# Patient Record
Sex: Male | Born: 1940 | Race: White | Hispanic: No | Marital: Married | Smoking: Never smoker
Health system: Southern US, Community
[De-identification: ages and names within clinical notes are randomized; demographics above are authoritative.]

---

## 2000-04-18 ENCOUNTER — Encounter: Payer: Self-pay | Admitting: Neurosurgery

## 2000-04-18 ENCOUNTER — Ambulatory Visit (HOSPITAL_COMMUNITY): Admission: RE | Admit: 2000-04-18 | Discharge: 2000-04-18 | Payer: Self-pay | Admitting: Neurosurgery

## 2003-10-22 DIAGNOSIS — L570 Actinic keratosis: Secondary | ICD-10-CM | POA: Insufficient documentation

## 2006-02-17 ENCOUNTER — Encounter: Admission: RE | Admit: 2006-02-17 | Discharge: 2006-02-17 | Payer: Self-pay | Admitting: Family Medicine

## 2006-02-22 ENCOUNTER — Encounter: Payer: Self-pay | Admitting: Neurological Surgery

## 2006-03-01 ENCOUNTER — Ambulatory Visit (HOSPITAL_COMMUNITY): Admission: RE | Admit: 2006-03-01 | Discharge: 2006-03-02 | Payer: Self-pay | Admitting: Neurological Surgery

## 2007-02-16 IMAGING — CR DG CHEST 2V
2 series · 2 of 2 positions shown · non-contrast
Comparison: No available priors.

CLINICAL DATA: Pre-op for back surgery. 
 CHEST - 2 VIEW ? 02/22/06:

[view not recorded (1 of 2)]
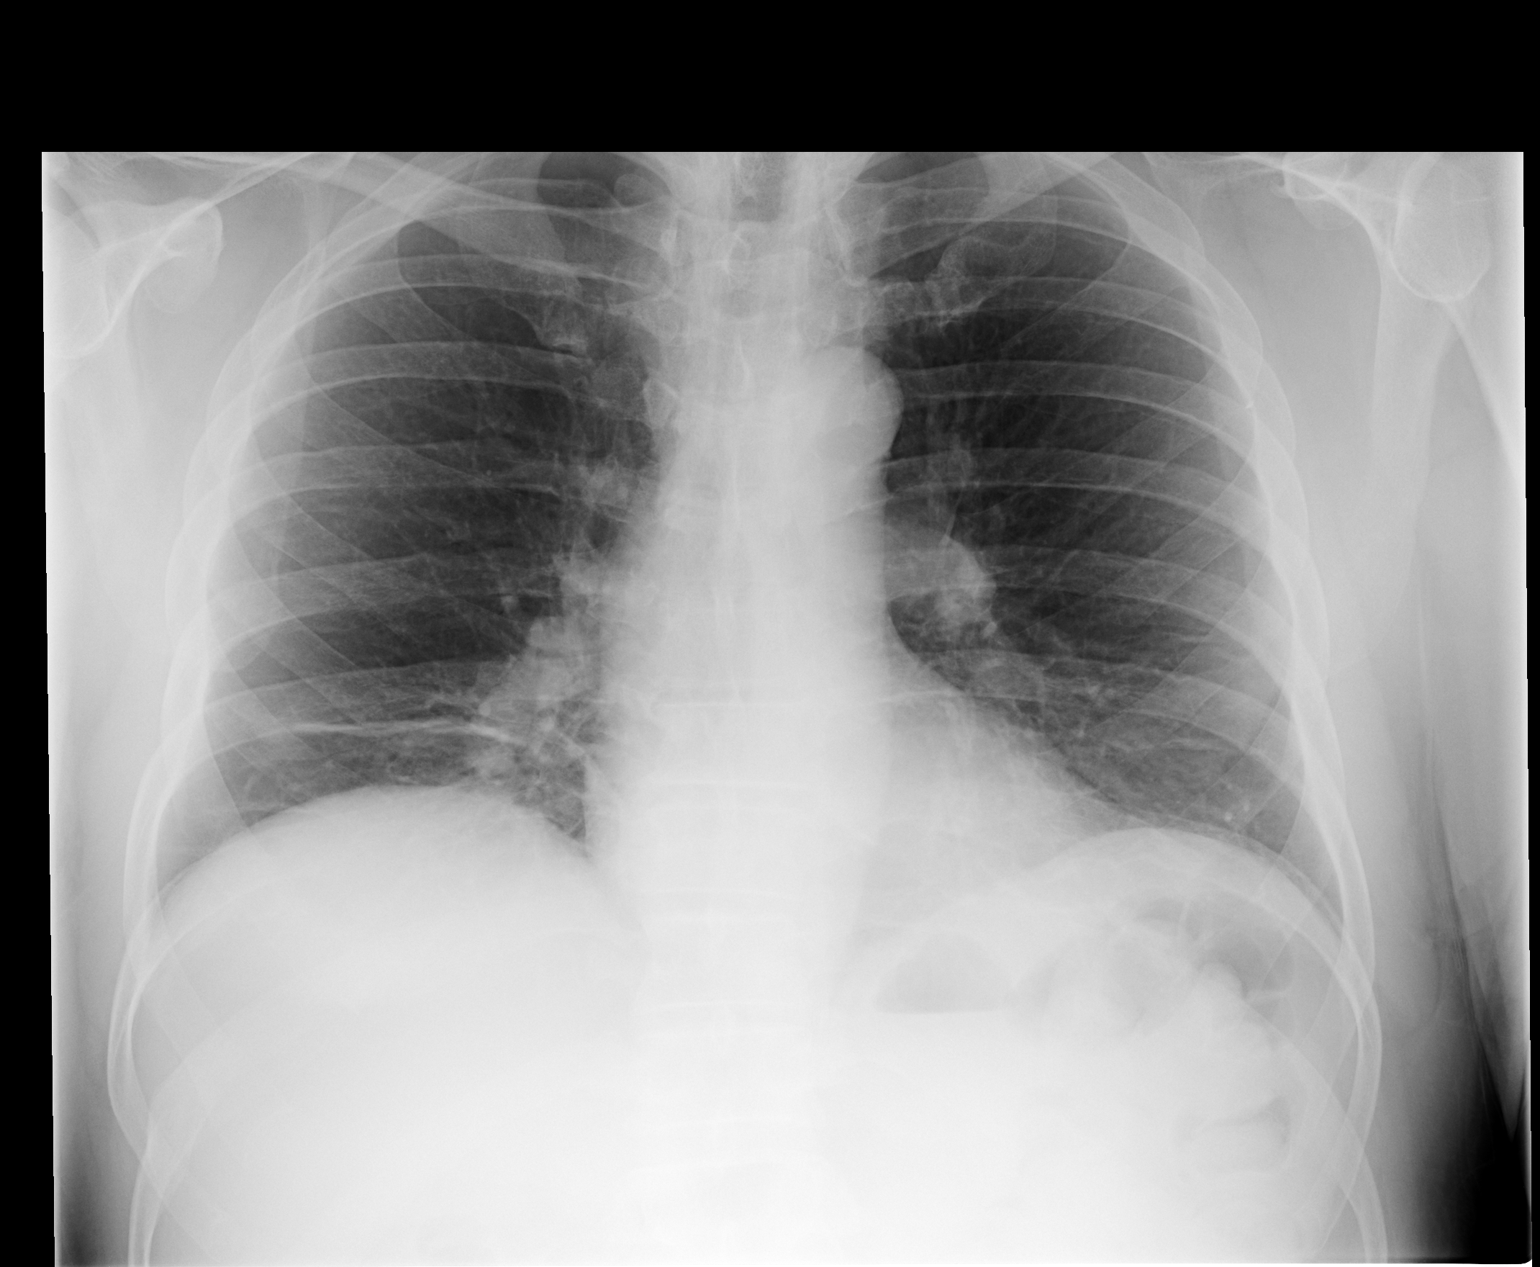

[view not recorded (2 of 2)]
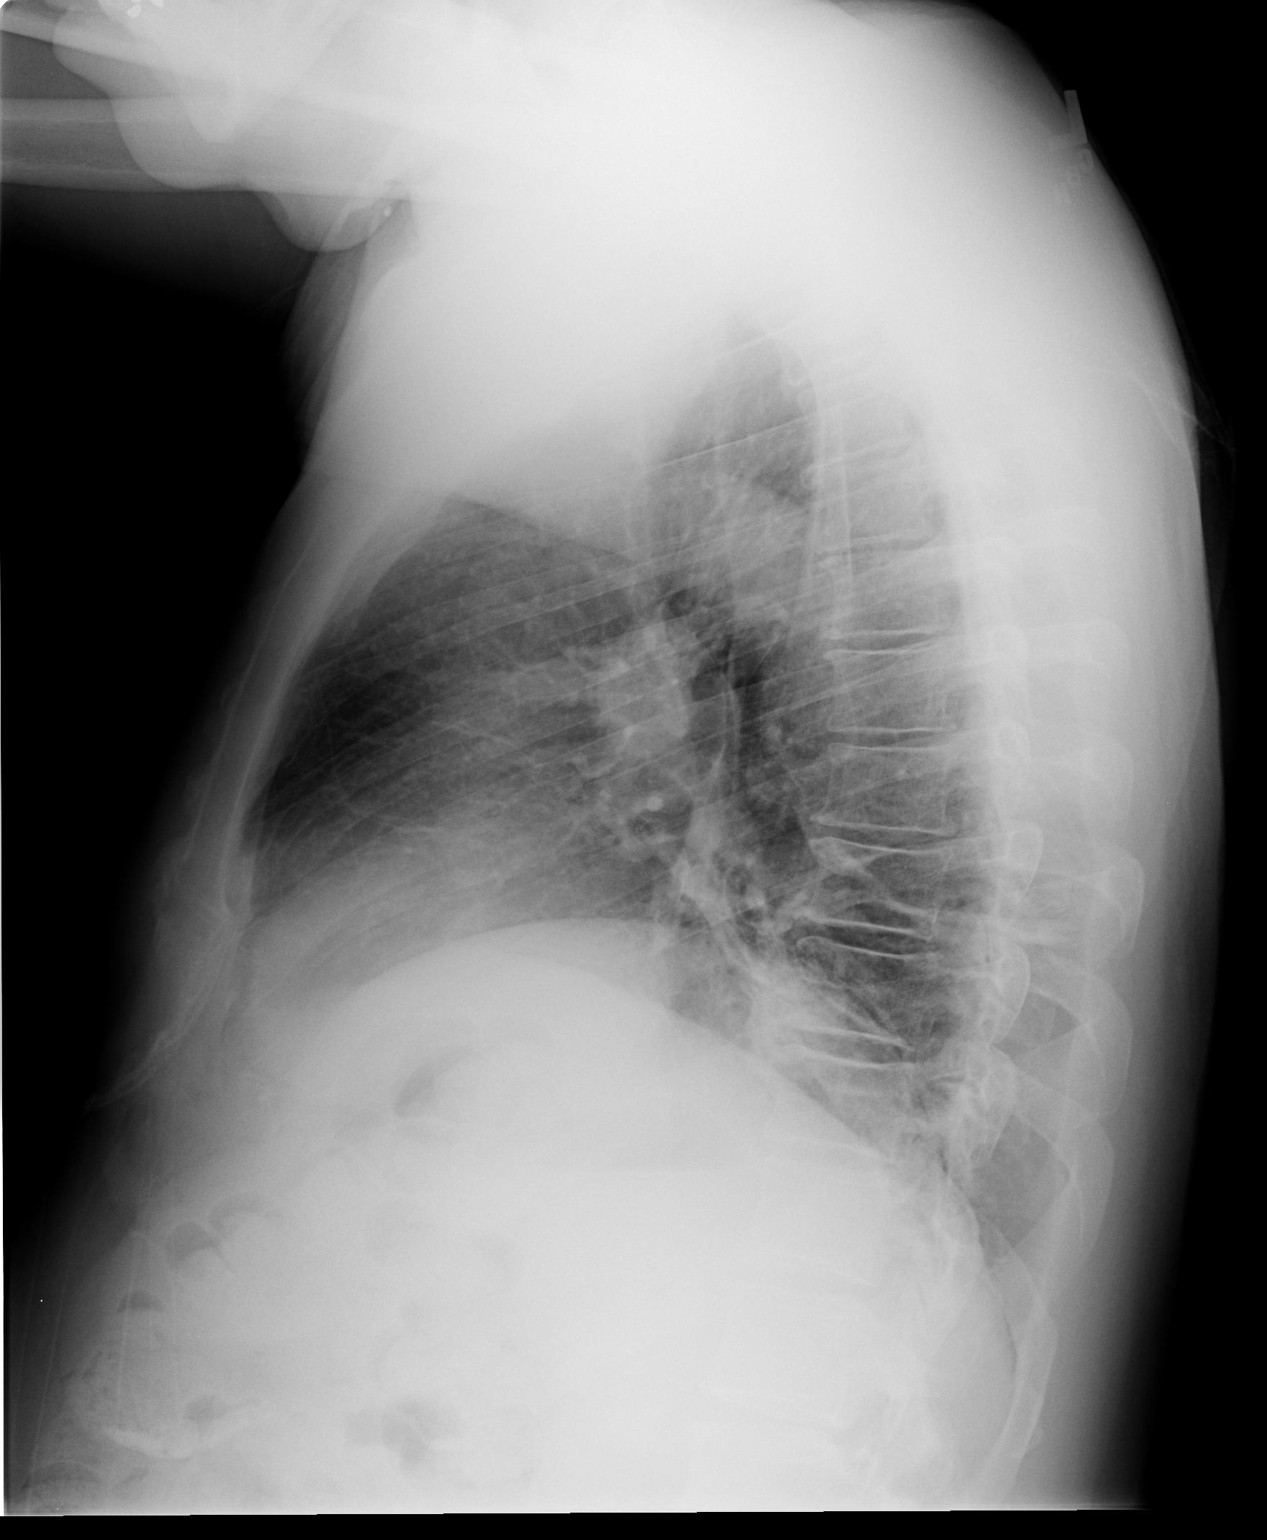

[2 of 2 positions shown; findings below may reference images not displayed]

FINDINGS: The trachea is midline.  Heart size is normal.  There is bibasilar segmental atelectasis and/or scar.   No pleural fluid.
IMPRESSION: Bibasilar subsegmental atelectasis and/or scar.

## 2007-02-23 IMAGING — RF DG LUMBAR SPINE 2-3V
1 series · 2 of 2 positions shown · non-contrast
Comparison: None.

CLINICAL DATA: Radiculopathy.  
 LUMBAR SPINE ? 2-3 VIEW ? 03/01/06:

[Series 1: run · 2 of 2 slices shown]
[im 1/2]
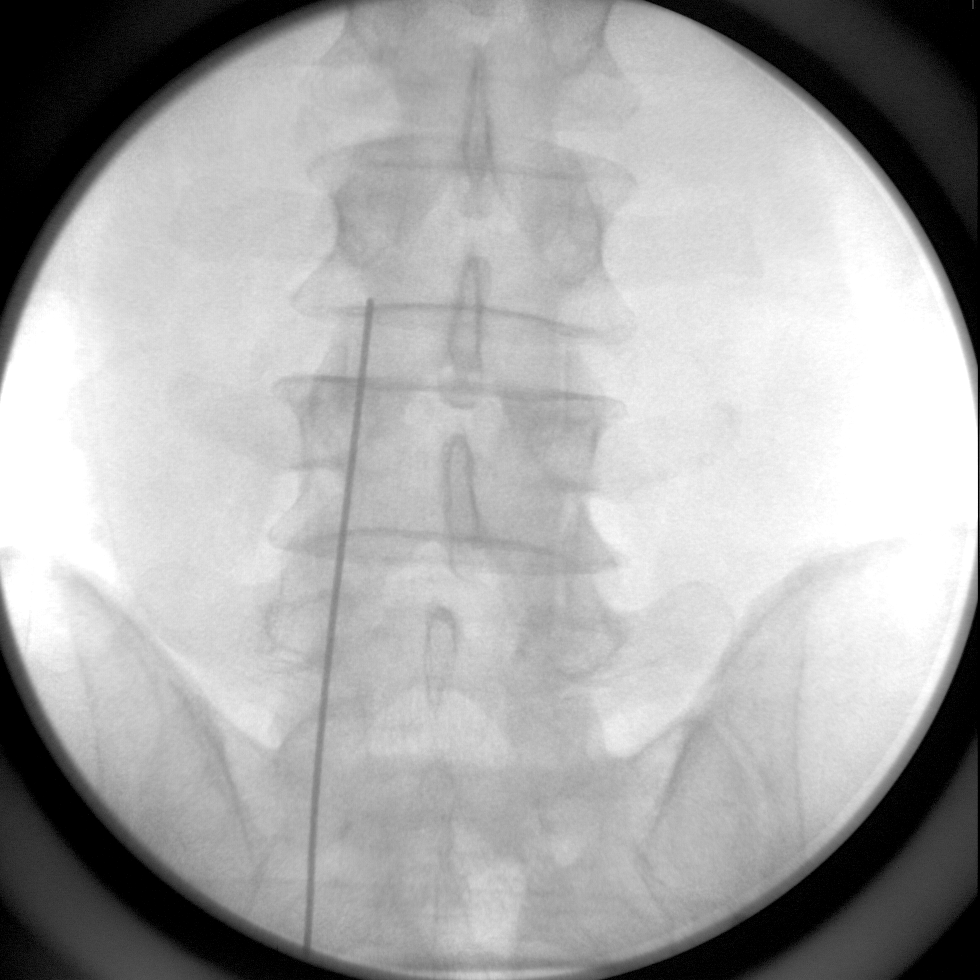
[im 2/2]
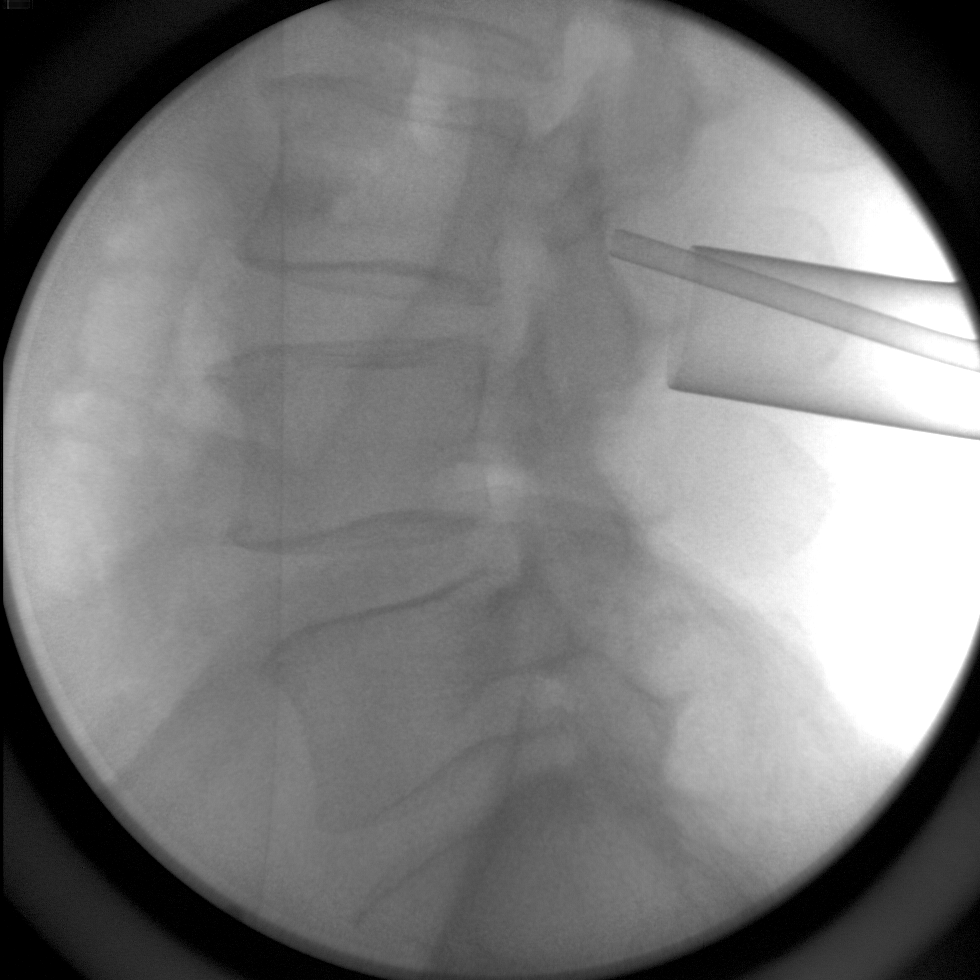

[2 of 2 positions shown; findings below may reference images not displayed]

FINDINGS: AP and lateral scout spot views were obtained with a C-arm. On the first film, a metallic rod overlies the lumbar spine with its tip along the lower margin of L3.  There is no right or left marker to know if this is right-sided or left-sided.  
 A lateral view was done showing an instrument posterior to the neural canal at the level of the pedicle of L3.
IMPRESSION: As above.

## 2010-08-13 DIAGNOSIS — E782 Mixed hyperlipidemia: Secondary | ICD-10-CM | POA: Insufficient documentation

## 2011-05-04 ENCOUNTER — Ambulatory Visit: Payer: Self-pay

## 2011-05-13 DIAGNOSIS — G4733 Obstructive sleep apnea (adult) (pediatric): Secondary | ICD-10-CM | POA: Insufficient documentation

## 2011-05-25 DIAGNOSIS — D481 Neoplasm of uncertain behavior of connective and other soft tissue: Secondary | ICD-10-CM | POA: Insufficient documentation

## 2011-12-02 DIAGNOSIS — C44320 Squamous cell carcinoma of skin of unspecified parts of face: Secondary | ICD-10-CM | POA: Insufficient documentation

## 2013-01-13 DIAGNOSIS — G8929 Other chronic pain: Secondary | ICD-10-CM | POA: Insufficient documentation

## 2013-08-25 DIAGNOSIS — Z85828 Personal history of other malignant neoplasm of skin: Secondary | ICD-10-CM | POA: Insufficient documentation

## 2013-11-13 DIAGNOSIS — I10 Essential (primary) hypertension: Secondary | ICD-10-CM | POA: Insufficient documentation

## 2013-11-13 DIAGNOSIS — N183 Chronic kidney disease, stage 3 unspecified: Secondary | ICD-10-CM | POA: Insufficient documentation

## 2014-01-15 DIAGNOSIS — M2550 Pain in unspecified joint: Secondary | ICD-10-CM | POA: Insufficient documentation

## 2014-05-22 DIAGNOSIS — D472 Monoclonal gammopathy: Secondary | ICD-10-CM | POA: Insufficient documentation

## 2014-05-22 DIAGNOSIS — E538 Deficiency of other specified B group vitamins: Secondary | ICD-10-CM | POA: Insufficient documentation

## 2014-11-01 DIAGNOSIS — I482 Chronic atrial fibrillation, unspecified: Secondary | ICD-10-CM | POA: Insufficient documentation

## 2016-01-29 DIAGNOSIS — E119 Type 2 diabetes mellitus without complications: Secondary | ICD-10-CM | POA: Insufficient documentation

## 2016-02-06 DIAGNOSIS — M21619 Bunion of unspecified foot: Secondary | ICD-10-CM | POA: Insufficient documentation

## 2016-02-06 DIAGNOSIS — M2010 Hallux valgus (acquired), unspecified foot: Secondary | ICD-10-CM | POA: Insufficient documentation

## 2016-02-06 DIAGNOSIS — I77810 Thoracic aortic ectasia: Secondary | ICD-10-CM | POA: Insufficient documentation

## 2016-02-16 DIAGNOSIS — M47817 Spondylosis without myelopathy or radiculopathy, lumbosacral region: Secondary | ICD-10-CM | POA: Insufficient documentation

## 2016-12-31 DIAGNOSIS — Z79899 Other long term (current) drug therapy: Secondary | ICD-10-CM | POA: Insufficient documentation

## 2017-03-31 DIAGNOSIS — E559 Vitamin D deficiency, unspecified: Secondary | ICD-10-CM | POA: Insufficient documentation

## 2018-03-31 DIAGNOSIS — I4891 Unspecified atrial fibrillation: Secondary | ICD-10-CM | POA: Insufficient documentation

## 2018-03-31 DIAGNOSIS — L608 Other nail disorders: Secondary | ICD-10-CM | POA: Insufficient documentation

## 2018-03-31 DIAGNOSIS — Z6827 Body mass index (BMI) 27.0-27.9, adult: Secondary | ICD-10-CM | POA: Insufficient documentation

## 2018-03-31 DIAGNOSIS — H9193 Unspecified hearing loss, bilateral: Secondary | ICD-10-CM | POA: Insufficient documentation

## 2018-03-31 DIAGNOSIS — J019 Acute sinusitis, unspecified: Secondary | ICD-10-CM | POA: Insufficient documentation

## 2018-03-31 DIAGNOSIS — M159 Polyosteoarthritis, unspecified: Secondary | ICD-10-CM | POA: Insufficient documentation

## 2018-03-31 DIAGNOSIS — D6869 Other thrombophilia: Secondary | ICD-10-CM | POA: Insufficient documentation

## 2018-03-31 DIAGNOSIS — Z0001 Encounter for general adult medical examination with abnormal findings: Secondary | ICD-10-CM | POA: Insufficient documentation

## 2018-03-31 DIAGNOSIS — H259 Unspecified age-related cataract: Secondary | ICD-10-CM | POA: Insufficient documentation

## 2019-07-31 DIAGNOSIS — S5002XA Contusion of left elbow, initial encounter: Secondary | ICD-10-CM | POA: Insufficient documentation

## 2019-07-31 DIAGNOSIS — Z7901 Long term (current) use of anticoagulants: Secondary | ICD-10-CM | POA: Insufficient documentation

## 2022-02-18 ENCOUNTER — Other Ambulatory Visit: Payer: Self-pay

## 2022-02-18 ENCOUNTER — Ambulatory Visit: Payer: Medicare Other | Admitting: Sports Medicine

## 2022-02-18 ENCOUNTER — Encounter: Payer: Self-pay | Admitting: Sports Medicine

## 2022-02-18 DIAGNOSIS — B351 Tinea unguium: Secondary | ICD-10-CM | POA: Diagnosis not present

## 2022-02-18 DIAGNOSIS — M79675 Pain in left toe(s): Secondary | ICD-10-CM | POA: Diagnosis not present

## 2022-02-18 DIAGNOSIS — E119 Type 2 diabetes mellitus without complications: Secondary | ICD-10-CM | POA: Diagnosis not present

## 2022-02-18 DIAGNOSIS — M79674 Pain in right toe(s): Secondary | ICD-10-CM | POA: Diagnosis not present

## 2022-02-18 NOTE — Progress Notes (Signed)
Subjective: ?Daniel Cobb is a 81 y.o. male patient with history of diabetes who presents to office today complaining of long,mildly painful nails  while ambulating in shoes; unable to trim. Patient states that the glucose reading this morning was 140 last A1c 7 and last visit to PCP was in December.  Patient is assisted by Daniel Cobb who is also a patient here as well this visit.  No other pedal complaints noted. ? ?Patient Active Problem List  ? Diagnosis Date Noted  ? Chronic anticoagulation 07/31/2019  ? Contusion of left elbow 07/31/2019  ? Acute sinusitis 03/31/2018  ? Age-related cataract 03/31/2018  ? Bilateral hearing loss 03/31/2018  ? BMI 27.0-27.9,adult 03/31/2018  ? Encounter for preventative adult health care exam with abnormal findings 03/31/2018  ? Hypercoagulable state due to atrial fibrillation ([place]) 03/31/2018  ? Osteoarthritis of multiple joints 03/31/2018  ? Toenail deformity 03/31/2018  ? Vitamin D deficiency 03/31/2017  ? Controlled substance agreement signed 12/31/2016  ? Spondylosis of lumbosacral region without myelopathy or radiculopathy 02/16/2016  ? 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 Hallux valgus with bunions 02/06/2016  ? Type 2 diabetes mellitus ([place]) 01/29/2016  ? Chronic atrial fibrillation ([place]) 11/01/2014  ? Monoclonal gammopathy, benign 05/22/2014  ? Vitamin B12 deficiency 05/22/2014  ? Arthralgia 01/15/2014  ? CKD (chronic kidney disease), stage III ([place]) 11/13/2013  ? Essential hypertension 11/13/2013  ? History of nonmelanoma skin cancer 08/25/2013  ? Chronic bilateral low back pain without sciatica 01/13/2013  ? 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 Obstructive sleep apnea 05/13/2011  ? Mixed hyperlipidemia 08/13/2010  ? Actinic keratosis 10/22/2003  ? ?Current Outpatient Medications on File Prior to Visit  ?Medication Sig Dispense Refill  ? amLODipine (NORVASC) 10 MG tablet Take 1 tablet by mouth daily.    ?  apixaban (ELIQUIS) 5 MG TABS tablet Take 1 tablet by mouth 2 (two) times daily.    ? atenolol (TENORMIN) 50 MG tablet TAKE 1 & 1/2 (ONE & ONE-HALF) TABLETS BY MOUTH  DAILY    ? atorvastatin (LIPITOR) 20 MG tablet Take 1 tablet by mouth daily.    ? Blood Glucose Monitoring Suppl (GLUCOCOM BLOOD GLUCOSE MONITOR) DEVI Use to check blood sugar once daily. Dx: E11.22, N18.3    ? chlorthalidone (HYGROTON) 25 MG tablet Take 1 tablet by mouth every morning.    ? doxazosin (CARDURA) 2 MG tablet Take by mouth.    ? fluticasone (FLONASE) 50 MCG/ACT nasal spray 2 sprays by Each Nare route daily.    ? glipiZIDE (GLUCOTROL XL) 10 MG 24 hr tablet Take 1 tablet by mouth daily.    ? glucose blood (ACCU-CHEK AVIVA PLUS) test strip Use to check blood sugar once daily. Dx: E11.22, N18.3    ? hydrALAZINE (APRESOLINE) 100 MG tablet Take by mouth.    ? lisinopril (ZESTRIL) 40 MG tablet Take 1 tablet by mouth daily.    ? magnesium oxide (MAG-OX) 400 MG tablet Take 1 tablet by mouth 2 (two) times daily.    ? Multiple Vitamins-Minerals (CENTRUM SILVER PO) Take by mouth.    ? Omega-3 Fatty Acids (FISH OIL) 1000 MG CAPS Take by mouth.    ? potassium chloride SA (KLOR-CON M) 20 MEQ tablet Take 1 tablet by mouth daily.    ? traMADol (ULTRAM) 50 MG tablet Take by mouth.    ? carvedilol (COREG) 6.25 MG tablet Take 9.375 mg by mouth 2 (two) times daily.    ?  LAGEVRIO 200 MG CAPS capsule Take 4 capsules by mouth 2 (two) times daily.    ? ?No current facility-administered medications on file prior to visit.  ? ?No Known Allergies ? ?No results found for this or any previous visit (from the past 2160 hour(s)). ? ?Objective: ?General: Patient is awake, alert, and oriented x 3 and in no acute distress. ? ?Integument: Skin is warm, dry and supple bilateral. Nails are tender, long, thickened and  ?dystrophic with subungual debris, consistent with onychomycosis, 1-5 bilateral. No signs of infection. No open lesions or preulcerative lesions present  bilateral. Remaining integument unremarkable. ? ?Vasculature:  Dorsalis Pedis pulse 1/4 bilateral. Posterior Tibial pulse 1/4 bilateral.  ?Capillary fill time <3 sec 1-5 bilateral. Positive hair growth to the level of the digits. ?Temperature gradient within normal limits. No varicosities present bilateral. No edema present bilateral.  ? ?Neurology: The patient has intact sensation measured with a 5.07/10g Semmes Weinstein Monofilament at all pedal sites bilateral . Vibratory sensation intact bilateral with tuning fork. No Babinski sign present bilateral.  ? ?Musculoskeletal: Asymptomatic bunion and pes planus pedal deformities noted bilateral. Muscular strength 5/5 in all lower extremity muscular groups bilateral without pain on range of motion . No tenderness with calf compression bilateral. ? ?Assessment and Plan: ?Problem List Items Addressed This Visit   ?None ?Visit Diagnoses   ? ? Comprehensive diabetic foot examination, type 2 DM, encounter for [facility])    -  Primary  ? Relevant Medications  ? atorvastatin (LIPITOR) 20 MG tablet  ? glipiZIDE (GLUCOTROL XL) 10 MG 24 hr tablet  ? lisinopril (ZESTRIL) 40 MG tablet  ? Pain due to onychomycosis of toenails of both feet      ? Relevant Medications  ? LAGEVRIO 200 MG CAPS capsule  ? ?  ? ? ?-Examined patient. ?-Discussed and educated patient on diabetic foot care, especially with  ?regards to the vascular, neurological and musculoskeletal systems.  ?-Stressed the importance of good glycemic control and the detriment of not  ?controlling glucose levels in relation to the foot. ?-Mechanically debrided all nails 1-5 bilateral using sterile nail nipper and filed with dremel without incident  ?-Answered all patient questions ?-Patient to return  in 3 months for at risk foot care ?-Patient advised to call the office if any problems or questions arise in the meantime. ? ?Landis Martins, DPM ?

## 2022-06-18 ENCOUNTER — Ambulatory Visit (INDEPENDENT_AMBULATORY_CARE_PROVIDER_SITE_OTHER): Payer: Medicare Other | Admitting: Podiatry

## 2022-06-18 ENCOUNTER — Encounter: Payer: Self-pay | Admitting: Podiatry

## 2022-06-18 DIAGNOSIS — M79675 Pain in left toe(s): Secondary | ICD-10-CM

## 2022-06-18 DIAGNOSIS — H2513 Age-related nuclear cataract, bilateral: Secondary | ICD-10-CM | POA: Insufficient documentation

## 2022-06-18 DIAGNOSIS — B351 Tinea unguium: Secondary | ICD-10-CM | POA: Diagnosis not present

## 2022-06-18 DIAGNOSIS — E119 Type 2 diabetes mellitus without complications: Secondary | ICD-10-CM | POA: Diagnosis not present

## 2022-06-18 DIAGNOSIS — M79674 Pain in right toe(s): Secondary | ICD-10-CM | POA: Diagnosis not present

## 2022-06-27 NOTE — Progress Notes (Signed)
  Subjective:  Patient ID: Daniel Cobb, male    DOB: 10/10/1941,  MRN: [id number]  Daniel Cobb presents to clinic today for preventative diabetic foot care and painful elongated mycotic toenails 1-5 bilaterally which are tender when wearing enclosed shoe gear. Pain is relieved with periodic professional debridement.  Last A1c was 7.6%. Patient did not check blood glucose today.  New problem(s): None.   PCP is [name], MD , and last visit was June 11, 2022.  No Known Allergies  Review of Systems: Negative except as noted in the HPI.  Objective: No changes noted in today's physical examination. Vascular Examination: CFT <3 seconds b/l. DP/PT pulses faintly palpable b/l. Digital hair sparse. Skin temperature gradient warm to warm b/l. No ischemia or gangrene. No cyanosis or clubbing noted b/l.  No varicosities b/l. No edema noted b/l LE.   Neurological Examination: Sensation grossly intact b/l with 10 gram monofilament. Vibratory sensation intact b/l.   Dermatological Examination: Pedal skin thin, shiny and atrophic b/l. Toenails 1-5 b/l thick, discolored, elongated with subungual debris and pain on dorsal palpation.    Musculoskeletal Examination: Muscle strength 5/5 to b/l LE. HAV with bunion deformity noted b/l LE. Pes planus deformity noted bilateral LE.  Radiographs: None  Assessment/Plan: 1. Pain due to onychomycosis of toenails of both feet   2. Type 2 diabetes mellitus without complication, without long-term current use of insulin ([place])     -Patient was evaluated and treated. All patient's and/or POA's questions/concerns answered on today's visit. -Patient to continue soft, supportive shoe gear daily. -Mycotic toenails 1-5 bilaterally were debrided in length and girth with sterile nail nippers and dremel without incident. -Patient/POA to call should there be question/concern in the interim.   Return in about 3 months (around 09/18/2022).  Marzetta Board, DPM

## 2022-09-24 ENCOUNTER — Ambulatory Visit (INDEPENDENT_AMBULATORY_CARE_PROVIDER_SITE_OTHER): Payer: Medicare Other | Admitting: Podiatry

## 2022-09-24 ENCOUNTER — Encounter: Payer: Self-pay | Admitting: Podiatry

## 2022-09-24 DIAGNOSIS — M79674 Pain in right toe(s): Secondary | ICD-10-CM

## 2022-09-24 DIAGNOSIS — M79675 Pain in left toe(s): Secondary | ICD-10-CM | POA: Diagnosis not present

## 2022-09-24 DIAGNOSIS — E119 Type 2 diabetes mellitus without complications: Secondary | ICD-10-CM | POA: Diagnosis not present

## 2022-09-24 DIAGNOSIS — B351 Tinea unguium: Secondary | ICD-10-CM

## 2022-09-24 NOTE — Progress Notes (Signed)
  Subjective:  Patient ID: Daniel Cobb, male    DOB: 1941-06-06,  MRN: [id number]  [name] presents to clinic today for:  Chief Complaint  Patient presents with   Nail Problem    Diabetic foot care BS-148 A1C-7.4 Rosalie Doctor PCP VST-07/2022    New problem(s): None.   PCP is [name], MD , and last visit was  September 13, 2022.  No Known Allergies  Review of Systems: Negative except as noted in the HPI.  Objective: No changes noted in today's physical examination.  [name] is a pleasant 81 y.o. male WD, WN in NAD. AAO x 3. Vascular Examination: CFT <3 seconds b/l. DP/PT pulses faintly palpable b/l. Digital hair sparse. Skin temperature gradient warm to warm b/l. No ischemia or gangrene. No cyanosis or clubbing noted b/l. No varicosities b/l. No edema noted b/l LE.   Neurological Examination: Sensation grossly intact b/l with 10 gram monofilament. Vibratory sensation intact b/l.   Dermatological Examination: Pedal skin thin, shiny and atrophic b/l. Toenails 1-5 b/l thick, discolored, elongated with subungual debris and pain on dorsal palpation.    Musculoskeletal Examination: Muscle strength 5/5 to b/l LE. HAV with bunion deformity noted b/l LE. Pes planus deformity noted bilateral LE.  Radiographs: None  Assessment/Plan: 1. Pain due to onychomycosis of toenails of both feet   2. Type 2 diabetes mellitus without complication, without long-term current use of insulin (HCC)     No orders of the defined types were placed in this encounter.   -Consent given for treatment as described below: -Examined patient. -Toenails 1-5 b/l were debrided in length and girth with sterile nail nippers and dremel without iatrogenic bleeding.  -Patient/POA to call should there be question/concern in the interim.   Return in about 3 months (around 12/25/2022).  Marzetta Board, DPM

## 2023-01-07 ENCOUNTER — Ambulatory Visit: Payer: Medicare Other | Admitting: Podiatry

## 2023-01-07 VITALS — BP 180/83 | HR 66

## 2023-01-07 DIAGNOSIS — M79675 Pain in left toe(s): Secondary | ICD-10-CM | POA: Diagnosis not present

## 2023-01-07 DIAGNOSIS — M79674 Pain in right toe(s): Secondary | ICD-10-CM | POA: Diagnosis not present

## 2023-01-07 DIAGNOSIS — B351 Tinea unguium: Secondary | ICD-10-CM

## 2023-01-07 DIAGNOSIS — E119 Type 2 diabetes mellitus without complications: Secondary | ICD-10-CM

## 2023-01-07 NOTE — Progress Notes (Signed)
  Subjective:  Patient ID: Daniel Cobb, male    DOB: 1941/07/05,  MRN: [id number]  Daniel Cobb presents to clinic today for preventative diabetic foot care and painful elongated mycotic toenails 1-5 bilaterally which are tender when wearing enclosed shoe gear. Pain is relieved with periodic professional debridement.  Chief Complaint  Patient presents with   Diabetes    Diabetic foot care, A1c- 7.8 BG-224, nail trim, last seen PCP 3 months ago    New problem(s): None.   PCP is [name], MD.  No Known Allergies  Review of Systems: Negative except as noted in the HPI.  Objective: No changes noted in today's physical examination. Vitals:   01/07/23 1352  BP: (!) 180/83  Pulse: 55   Daniel Cobb is a pleasant 82 y.o. male WD, WN in NAD. AAO x 3.  Vascular Examination: CFT <3 seconds b/l. DP/PT pulses faintly palpable b/l. Digital hair sparse. Skin temperature gradient warm to warm b/l. No ischemia or gangrene. No cyanosis or clubbing noted b/l. No varicosities b/l. No edema noted b/l LE.   Neurological Examination: Sensation grossly intact b/l with 10 gram monofilament. Vibratory sensation intact b/l.   Dermatological Examination: Pedal skin thin, shiny and atrophic b/l. Toenails 1-5 b/l thick, discolored, elongated with subungual debris and pain on dorsal palpation.    Musculoskeletal Examination: Muscle strength 5/5 to b/l LE. HAV with bunion deformity noted b/l LE. Pes planus deformity noted bilateral LE.  Radiographs: None  Assessment/Plan: 1. Pain due to onychomycosis of toenails of both feet   2. Type 2 diabetes mellitus without complication, without long-term current use of insulin ([place])     -Patient was evaluated and treated. All patient's and/or POA's questions/concerns answered on today's visit. -Continue foot and shoe inspections daily. Monitor blood glucose per PCP/Endocrinologist's recommendations. -Continue supportive shoe gear daily. -Mycotic toenails 1-5  bilaterally were debrided in length and girth with sterile nail nippers and dremel without incident. -Patient/POA to call should there be question/concern in the interim.   No follow-ups on file.  Marzetta Board, DPM

## 2023-01-11 ENCOUNTER — Encounter: Payer: Self-pay | Admitting: Podiatry

## 2023-03-31 ENCOUNTER — Ambulatory Visit: Payer: Medicare Other | Admitting: Podiatry

## 2023-04-01 ENCOUNTER — Ambulatory Visit: Payer: Medicare Other | Admitting: Podiatry

## 2023-04-08 ENCOUNTER — Ambulatory Visit: Payer: Medicare Other | Admitting: Podiatry

## 2023-04-22 ENCOUNTER — Ambulatory Visit: Payer: Medicare Other | Admitting: Podiatry

## 2023-04-23 ENCOUNTER — Ambulatory Visit: Payer: Medicare HMO | Admitting: Podiatry

## 2023-04-23 DIAGNOSIS — M79675 Pain in left toe(s): Secondary | ICD-10-CM

## 2023-04-23 DIAGNOSIS — M79674 Pain in right toe(s): Secondary | ICD-10-CM

## 2023-04-23 DIAGNOSIS — E119 Type 2 diabetes mellitus without complications: Secondary | ICD-10-CM

## 2023-04-23 DIAGNOSIS — B351 Tinea unguium: Secondary | ICD-10-CM | POA: Diagnosis not present

## 2023-04-23 NOTE — Progress Notes (Signed)
  Subjective:  Patient ID: Daniel Cobb, male    DOB: [birth year],  MRN: [id number]  Chief Complaint  Patient presents with   Nail Problem    Diabetic Foot Care     82 y.o. male presents with the above complaint. History confirmed with patient. Patient presenting with pain related to dystrophic thickened elongated nails. Patient is unable to trim own nails related to nail dystrophy and/or mobility issues. Patient does have a history of T2DM.   Objective:  Physical Exam: warm, good capillary refill nail exam onychomycosis of the toenails, onycholysis, and dystrophic nails DP pulses palpable, PT pulses palpable, and protective sensation absent Left Foot:  Pain with palpation of nails due to elongation and dystrophic growth.  Right Foot: Pain with palpation of nails due to elongation and dystrophic growth.   Assessment:   1. Pain due to onychomycosis of toenails of both feet   2. Type 2 diabetes mellitus without complication, without long-term current use of insulin (HCC)      Plan:  Patient was evaluated and treated and all questions answered.  #Onychomycosis with pain  -Nails palliatively debrided as below. -Educated on self-care  Procedure: Nail Debridement Rationale: Pain Type of Debridement: manual, sharp debridement. Instrumentation: Nail nipper, rotary burr. Number of Nails: 10  Return in about 3 months (around 07/24/2023) for [facility].         Corinna Gab, DPM [facility] / [facility]

## 2023-07-26 ENCOUNTER — Ambulatory Visit (INDEPENDENT_AMBULATORY_CARE_PROVIDER_SITE_OTHER): Payer: Medicare HMO | Admitting: Podiatry

## 2023-07-26 DIAGNOSIS — M79675 Pain in left toe(s): Secondary | ICD-10-CM | POA: Diagnosis not present

## 2023-07-26 DIAGNOSIS — B351 Tinea unguium: Secondary | ICD-10-CM

## 2023-07-26 DIAGNOSIS — M79674 Pain in right toe(s): Secondary | ICD-10-CM | POA: Diagnosis not present

## 2023-07-26 DIAGNOSIS — E119 Type 2 diabetes mellitus without complications: Secondary | ICD-10-CM | POA: Diagnosis not present

## 2023-07-26 NOTE — Progress Notes (Signed)
  Subjective:  Patient ID: Daniel Cobb, male    DOB: 11/12/1941,  MRN: [id number]  Chief Complaint  Patient presents with   Diabetes    [facility] BS - 128 A1C - 6.7    82 y.o. male presents with the above complaint. History confirmed with patient. Patient presenting with pain related to dystrophic thickened elongated nails. Patient is unable to trim own nails related to nail dystrophy and/or mobility issues. Patient does have a history of T2DM.   Objective:  Physical Exam: warm, good capillary refill nail exam onychomycosis of the toenails, onycholysis, and dystrophic nails DP pulses palpable, PT pulses palpable, and protective sensation absent Left Foot:  Pain with palpation of nails due to elongation and dystrophic growth.  Right Foot: Pain with palpation of nails due to elongation and dystrophic growth.   Assessment:   1. Pain due to onychomycosis of toenails of both feet   2. Type 2 diabetes mellitus without complication, without long-term current use of insulin (HCC)       Plan:  Patient was evaluated and treated and all questions answered.  #Onychomycosis with pain  -Nails palliatively debrided as below. -Educated on self-care  Procedure: Nail Debridement Rationale: Pain Type of Debridement: manual, sharp debridement. Instrumentation: Nail nipper, rotary burr. Number of Nails: 10  Return in about 3 months (around 10/26/2023) for [facility].         Corinna Gab, DPM [facility] / [facility]

## 2023-11-01 ENCOUNTER — Ambulatory Visit: Payer: Medicare HMO | Admitting: Podiatry

## 2023-11-02 ENCOUNTER — Ambulatory Visit (INDEPENDENT_AMBULATORY_CARE_PROVIDER_SITE_OTHER): Payer: Medicare HMO | Admitting: Podiatry

## 2023-11-02 DIAGNOSIS — M79675 Pain in left toe(s): Secondary | ICD-10-CM

## 2023-11-02 DIAGNOSIS — B351 Tinea unguium: Secondary | ICD-10-CM

## 2023-11-02 DIAGNOSIS — E119 Type 2 diabetes mellitus without complications: Secondary | ICD-10-CM

## 2023-11-02 DIAGNOSIS — M79674 Pain in right toe(s): Secondary | ICD-10-CM | POA: Diagnosis not present

## 2023-11-02 NOTE — Progress Notes (Signed)
  Subjective:  Patient ID: Daniel Cobb, male    DOB: Aug 03, 1941,  MRN: [id number]  Chief Complaint  Patient presents with   Foot care    Last A1c: 7.0. Takes Eliquis. Needs toenails trimmed.     82 y.o. male presents with the above complaint. History confirmed with patient. Patient presenting with pain related to dystrophic thickened elongated nails. Patient is unable to trim own nails related to nail dystrophy and/or mobility issues. Patient does have a history of T2DM.   Objective:  Physical Exam: warm, good capillary refill nail exam onychomycosis of the toenails, onycholysis, and dystrophic nails DP pulses palpable, PT pulses palpable, and protective sensation absent Left Foot:  Pain with palpation of nails due to elongation and dystrophic growth.  Right Foot: Pain with palpation of nails due to elongation and dystrophic growth.   Assessment:   1. Pain due to onychomycosis of toenails of both feet   2. Type 2 diabetes mellitus without complication, without long-term current use of insulin (HCC)        Plan:  Patient was evaluated and treated and all questions answered.  #Onychomycosis with pain  -Nails palliatively debrided as below. -Educated on self-care  Procedure: Nail Debridement Rationale: Pain Type of Debridement: manual, sharp debridement. Instrumentation: Nail nipper, rotary burr. Number of Nails: 10  Return in about 3 months (around 02/02/2024) for [facility]         Corinna Gab, DPM [facility] / [facility]

## 2024-01-31 ENCOUNTER — Ambulatory Visit: Payer: Medicare HMO | Admitting: Podiatry

## 2024-02-01 ENCOUNTER — Ambulatory Visit (INDEPENDENT_AMBULATORY_CARE_PROVIDER_SITE_OTHER): Payer: Medicare HMO | Admitting: Podiatry

## 2024-02-01 ENCOUNTER — Encounter: Payer: Self-pay | Admitting: Podiatry

## 2024-02-01 DIAGNOSIS — M79674 Pain in right toe(s): Secondary | ICD-10-CM | POA: Diagnosis not present

## 2024-02-01 DIAGNOSIS — B351 Tinea unguium: Secondary | ICD-10-CM

## 2024-02-01 DIAGNOSIS — M79675 Pain in left toe(s): Secondary | ICD-10-CM

## 2024-02-01 DIAGNOSIS — E119 Type 2 diabetes mellitus without complications: Secondary | ICD-10-CM

## 2024-02-01 NOTE — Progress Notes (Signed)
  Subjective:  Patient ID: Daniel Cobb, male    DOB: 09-11-1941,  MRN: [id number]  Chief Complaint  Patient presents with   [facility]    [facility] with no callous, Last A1c 7.2, two weeks ago.  Takes Elliquis    83 y.o. male presents with the above complaint. History confirmed with patient. Patient presenting with pain related to dystrophic thickened elongated nails. Patient is unable to trim own nails related to nail dystrophy and/or mobility issues. Patient does have a history of T2DM.  Patient does take Eliquis.  Objective:  Physical Exam: warm, good capillary refill nail exam onychomycosis of the toenails, onycholysis, and dystrophic nails DP pulses palpable, PT pulses palpable, and protective sensation absent Left Foot:  Pain with palpation of nails due to elongation and dystrophic growth.  Right Foot: Pain with palpation of nails due to elongation and dystrophic growth.  Asymptomatic bunion deformities and prominent fifth metatarsal heads laterally to both feet.  Assessment:   1. Pain due to onychomycosis of toenails of both feet   2. Type 2 diabetes mellitus without complication, without long-term current use of insulin (HCC)        Plan:  Patient was evaluated and treated and all questions answered.  #Onychomycosis with pain  -Nails palliatively debrided as below. -Educated on self-care  Procedure: Nail Debridement Rationale: Pain Type of Debridement: manual, sharp debridement. Instrumentation: Nail nipper, rotary burr. Number of Nails: 10  Return in about 3 months (around 04/30/2024) for Diabetic Foot Care.         Barbaraann Share, DPM [facility] / [facility]

## 2024-05-01 ENCOUNTER — Encounter: Payer: Self-pay | Admitting: Podiatry

## 2024-05-01 ENCOUNTER — Ambulatory Visit (INDEPENDENT_AMBULATORY_CARE_PROVIDER_SITE_OTHER): Admitting: Podiatry

## 2024-05-01 DIAGNOSIS — E119 Type 2 diabetes mellitus without complications: Secondary | ICD-10-CM

## 2024-05-01 DIAGNOSIS — M79675 Pain in left toe(s): Secondary | ICD-10-CM

## 2024-05-01 DIAGNOSIS — B351 Tinea unguium: Secondary | ICD-10-CM | POA: Diagnosis not present

## 2024-05-01 DIAGNOSIS — M79674 Pain in right toe(s): Secondary | ICD-10-CM | POA: Diagnosis not present

## 2024-05-01 NOTE — Progress Notes (Signed)
  Subjective:  Patient ID: Daniel Cobb, male    DOB: 03/16/1941,  MRN: [id number]  Chief Complaint  Patient presents with   [facility]    [facility] with out callous. Last A1c 7.0 last Thursday. Takes Eliquis    83 y.o. male presents with the above complaint. History confirmed with patient. Patient presenting with pain related to dystrophic thickened elongated nails. Patient is unable to trim own nails related to nail dystrophy and/or mobility issues. Patient does have a history of T2DM.  Patient does take Eliquis.  Objective:  Physical Exam: warm, good capillary refill nail exam onychomycosis of the toenails, onycholysis, and dystrophic nails DP pulses palpable, PT pulses palpable, and protective sensation absent Left Foot:  Pain with palpation of nails due to elongation and dystrophic growth.  Right Foot: Pain with palpation of nails due to elongation and dystrophic growth.  Asymptomatic bunion deformities and prominent fifth metatarsal heads laterally to both feet.  Assessment:   1. Pain due to onychomycosis of toenails of both feet   2. Type 2 diabetes mellitus without complication, without long-term current use of insulin (HCC)        Plan:  Patient was evaluated and treated and all questions answered.  #Onychomycosis with pain  -Nails palliatively debrided as below. -Educated on self-care  Procedure: Nail Debridement Rationale: Pain Type of Debridement: manual, sharp debridement. Instrumentation: Nail nipper, rotary burr. Number of Nails: 10  Return in about 3 months (around 08/01/2024) for Diabetic Foot Care.         Reina Cara, DPM [facility] / [facility]

## 2024-05-02 ENCOUNTER — Ambulatory Visit: Payer: Medicare HMO | Admitting: Podiatry

## 2024-05-09 ENCOUNTER — Ambulatory Visit: Payer: Medicare HMO | Admitting: Podiatry

## 2024-08-01 ENCOUNTER — Ambulatory Visit (INDEPENDENT_AMBULATORY_CARE_PROVIDER_SITE_OTHER): Admitting: Podiatry

## 2024-08-01 ENCOUNTER — Encounter: Payer: Self-pay | Admitting: Podiatry

## 2024-08-01 DIAGNOSIS — E119 Type 2 diabetes mellitus without complications: Secondary | ICD-10-CM

## 2024-08-01 DIAGNOSIS — B351 Tinea unguium: Secondary | ICD-10-CM | POA: Diagnosis not present

## 2024-08-01 DIAGNOSIS — M79675 Pain in left toe(s): Secondary | ICD-10-CM | POA: Diagnosis not present

## 2024-08-01 DIAGNOSIS — M79674 Pain in right toe(s): Secondary | ICD-10-CM

## 2024-08-01 NOTE — Progress Notes (Signed)
  Subjective:  Patient ID: Daniel Cobb, male    DOB: Jul 19, 1941,  MRN: [id number]  Chief Complaint  Patient presents with   [facility]    [facility] with out callous, last A1c was 6.6 in Aug and takes Eliquis.     83 y.o. male presents with the above complaint. History confirmed with patient. Patient presenting with pain related to dystrophic thickened elongated nails. Patient is unable to trim own nails related to nail dystrophy and/or mobility issues. Patient does have a history of T2DM.  Patient does take Eliquis.  Objective:  Physical Exam: warm, good capillary refill nail exam onychomycosis of the toenails, onycholysis, and dystrophic nails DP pulses palpable, PT pulses palpable, and protective sensation absent Left Foot:  Pain with palpation of nails due to elongation and dystrophic growth.  Right Foot: Pain with palpation of nails due to elongation and dystrophic growth.  Asymptomatic bunion deformities and prominent fifth metatarsal heads laterally to both feet.  Assessment:   1. Pain due to onychomycosis of toenails of both feet   2. Type 2 diabetes mellitus without complication, without long-term current use of insulin (HCC)        Plan:  Patient was evaluated and treated and all questions answered.  #Onychomycosis with pain  -Nails palliatively debrided as below. -Educated on self-care -Chronically anticoagulated on eliquis  Procedure: Nail Debridement Rationale: Pain Type of Debridement: manual, sharp debridement. Instrumentation: Nail nipper, rotary burr. Number of Nails: 10  Return in about 3 months (around 11/01/2024) for Diabetic Foot Care.         Ethan LITTIE Saddler, DPM [facility] / [facility]

## 2024-10-31 ENCOUNTER — Encounter: Payer: Self-pay | Admitting: Podiatry

## 2024-10-31 ENCOUNTER — Ambulatory Visit: Admitting: Podiatry

## 2024-10-31 DIAGNOSIS — B351 Tinea unguium: Secondary | ICD-10-CM

## 2024-10-31 DIAGNOSIS — M79674 Pain in right toe(s): Secondary | ICD-10-CM

## 2024-10-31 DIAGNOSIS — E119 Type 2 diabetes mellitus without complications: Secondary | ICD-10-CM | POA: Diagnosis not present

## 2024-10-31 DIAGNOSIS — M79675 Pain in left toe(s): Secondary | ICD-10-CM

## 2024-10-31 NOTE — Progress Notes (Signed)
  Subjective:  Patient ID: Daniel Cobb, male    DOB: Jan 30, 1941,  MRN: [id number]  Chief Complaint  Patient presents with   [facility]     [facility] no calluses. A1c 6.6. Eliquis.    83 y.o. male presents with the above complaint. History confirmed with patient. Patient presenting with pain related to dystrophic thickened elongated nails. Patient is unable to trim own nails related to nail dystrophy and/or mobility issues. Patient does have a history of T2DM.  Last A1c 6.6.  Patient does take Eliquis.  Objective:  Physical Exam: warm, good capillary refill nail exam onychomycosis of the toenails, onycholysis, and dystrophic nails DP pulses palpable, PT pulses palpable, and protective sensation absent Left Foot:  Pain with palpation of nails due to elongation and dystrophic growth.  Right Foot: Pain with palpation of nails due to elongation and dystrophic growth.  Asymptomatic bunion deformities and prominent fifth metatarsal heads laterally to both feet.  Assessment:   1. Pain due to onychomycosis of toenails of both feet   2. Type 2 diabetes mellitus without complication, without long-term current use of insulin (HCC)        Plan:  Patient was evaluated and treated and all questions answered.  #Onychomycosis with pain  -Nails palliatively debrided as below. -Educated on self-care - Chronically anticoagulated on Eliquis  Procedure: Nail Debridement Rationale: Pain Type of Debridement: manual, sharp debridement. Instrumentation: Nail nipper, rotary burr. Number of Nails: 10   Return in about 3 months (around 01/31/2025) for Diabetic Foot Care.         Ethan LITTIE Saddler, DPM [facility] / [facility]

## 2025-02-06 ENCOUNTER — Ambulatory Visit: Admitting: Podiatry
